# Patient Record
Sex: Female | Born: 1955 | Race: White | Hispanic: No | Smoking: Current every day smoker
Health system: Southern US, Community
[De-identification: ages and names within clinical notes are randomized; demographics above are authoritative.]

## PROBLEM LIST (undated history)

## (undated) DIAGNOSIS — I10 Essential (primary) hypertension: Secondary | ICD-10-CM

## (undated) DIAGNOSIS — Z86718 Personal history of other venous thrombosis and embolism: Secondary | ICD-10-CM

## (undated) DIAGNOSIS — F419 Anxiety disorder, unspecified: Secondary | ICD-10-CM

## (undated) DIAGNOSIS — F329 Major depressive disorder, single episode, unspecified: Secondary | ICD-10-CM

## (undated) DIAGNOSIS — F32A Depression, unspecified: Secondary | ICD-10-CM

## (undated) HISTORY — PX: DILATION AND CURETTAGE OF UTERUS: SHX78

## (undated) HISTORY — PX: CATARACT EXTRACTION: SUR2

## (undated) HISTORY — PX: TONSILLECTOMY: SUR1361

---

## 2014-10-08 ENCOUNTER — Emergency Department
Admission: EM | Admit: 2014-10-08 | Discharge: 2014-10-08 | Disposition: A | Discharge status: Home / Self Care | Payer: Self-pay | Source: Home / Self Care | Attending: Emergency Medicine | Admitting: Emergency Medicine

## 2014-10-08 ENCOUNTER — Encounter: Payer: Self-pay | Admitting: *Deleted

## 2014-10-08 ENCOUNTER — Emergency Department (INDEPENDENT_AMBULATORY_CARE_PROVIDER_SITE_OTHER): Payer: Self-pay

## 2014-10-08 DIAGNOSIS — J42 Unspecified chronic bronchitis: Secondary | ICD-10-CM

## 2014-10-08 DIAGNOSIS — J449 Chronic obstructive pulmonary disease, unspecified: Secondary | ICD-10-CM

## 2014-10-08 DIAGNOSIS — J209 Acute bronchitis, unspecified: Secondary | ICD-10-CM

## 2014-10-08 HISTORY — DX: Anxiety disorder, unspecified: F41.9

## 2014-10-08 HISTORY — DX: Essential (primary) hypertension: I10

## 2014-10-08 HISTORY — DX: Personal history of other venous thrombosis and embolism: Z86.718

## 2014-10-08 HISTORY — DX: Depression, unspecified: F32.A

## 2014-10-08 HISTORY — DX: Major depressive disorder, single episode, unspecified: F32.9

## 2014-10-08 MED ORDER — AZITHROMYCIN 250 MG PO TABS: 250.0000 mg | ORAL_TABLET | Freq: Every day | ORAL | Status: AC

## 2014-10-08 MED ORDER — ALBUTEROL SULFATE 108 (90 BASE) MCG/ACT IN AEPB: 2.0000 | INHALATION_SPRAY | Freq: Four times a day (QID) | RESPIRATORY_TRACT | Status: AC

## 2014-10-08 NOTE — Discharge Instructions (Signed)
Acute Bronchitis Bronchitis is inflammation of the airways that extend from the windpipe into the lungs (bronchi). The inflammation often causes mucus to develop. This leads to a cough, which is the most common symptom of bronchitis.  In acute bronchitis, the condition usually develops suddenly and goes away over time, usually in a couple weeks. Smoking, allergies, and asthma can make bronchitis worse. Repeated episodes of bronchitis may cause further lung problems.  CAUSES Acute bronchitis is most often caused by the same virus that causes a cold. The virus can spread from person to person (contagious) through coughing, sneezing, and touching contaminated objects. SIGNS AND SYMPTOMS   Cough.   Fever.   Coughing up mucus.   Body aches.   Chest congestion.   Chills.   Shortness of breath.   Sore throat.  DIAGNOSIS  Acute bronchitis is usually diagnosed through a physical exam. Your health care provider will also ask you questions about your medical history. Tests, such as chest X-rays, are sometimes done to rule out other conditions.  TREATMENT  Acute bronchitis usually goes away in a couple weeks. Oftentimes, no medical treatment is necessary. Medicines are sometimes given for relief of fever or cough. Antibiotic medicines are usually not needed but may be prescribed in certain situations. In some cases, an inhaler may be recommended to help reduce shortness of breath and control the cough. A cool mist vaporizer may also be used to help thin bronchial secretions and make it easier to clear the chest.  HOME CARE INSTRUCTIONS  Get plenty of rest.   Drink enough fluids to keep your urine clear or pale yellow (unless you have a medical condition that requires fluid restriction). Increasing fluids may help thin your respiratory secretions (sputum) and reduce chest congestion, and it will prevent dehydration.   Take medicines only as directed by your health care provider.  If  you were prescribed an antibiotic medicine, finish it all even if you start to feel better.  Avoid smoking and secondhand smoke. Exposure to cigarette smoke or irritating chemicals will make bronchitis worse. If you are a smoker, consider using nicotine gum or skin patches to help control withdrawal symptoms. Quitting smoking will help your lungs heal faster.   Reduce the chances of another bout of acute bronchitis by washing your hands frequently, avoiding people with cold symptoms, and trying not to touch your hands to your mouth, nose, or eyes.   Keep all follow-up visits as directed by your health care provider.  SEEK MEDICAL CARE IF: Your symptoms do not improve after 1 week of treatment.  SEEK IMMEDIATE MEDICAL CARE IF:  You develop an increased fever or chills.   You have chest pain.   You have severe shortness of breath.  You have bloody sputum.   You develop dehydration.  You faint or repeatedly feel like you are going to pass out.  You develop repeated vomiting.  You develop a severe headache. MAKE SURE YOU:   Understand these instructions.  Will watch your condition.  Will get help right away if you are not doing well or get worse. Document Released: 04/29/2004 Document Revised: 08/06/2013 Document Reviewed: 09/12/2012 [facility] Patient Information 2015 [place], [place]. This information is not intended to replace advice given to you by your health care provider. Make sure you discuss any questions you have with your health care provider.  Chronic Obstructive Pulmonary Disease Chronic obstructive pulmonary disease (COPD) is a common lung condition in which airflow from the lungs is limited. COPD  is a general term that can be used to describe many different lung problems that limit airflow, including both chronic bronchitis and emphysema. If you have COPD, your lung function will probably never return to normal, but there are measures you can take to improve lung  function and make yourself feel better.  CAUSES   Smoking (common).   Exposure to secondhand smoke.   Genetic problems.  Chronic inflammatory lung diseases or recurrent infections. SYMPTOMS   Shortness of breath, especially with physical activity.   Deep, persistent (chronic) cough with a large amount of thick mucus.   Wheezing.   Rapid breaths (tachypnea).   Gray or bluish discoloration (cyanosis) of the skin, especially in fingers, toes, or lips.   Fatigue.   Weight loss.   Frequent infections or episodes when breathing symptoms become much worse (exacerbations).   Chest tightness. DIAGNOSIS  Your health care provider will take a medical history and perform a physical examination to make the initial diagnosis. Additional tests for COPD may include:   Lung (pulmonary) function tests.  Chest X-ray.  CT scan.  Blood tests. TREATMENT  Treatment available to help you feel better when you have COPD includes:   Inhaler and nebulizer medicines. These help manage the symptoms of COPD and make your breathing more comfortable.  Supplemental oxygen. Supplemental oxygen is only helpful if you have a low oxygen level in your blood.   Exercise and physical activity. These are beneficial for nearly all people with COPD. Some people may also benefit from a pulmonary rehabilitation program. HOME CARE INSTRUCTIONS   Take all medicines (inhaled or pills) as directed by your health care provider.  Avoid over-the-counter medicines or cough syrups that dry up your airway (such as antihistamines) and slow down the elimination of secretions unless instructed otherwise by your health care provider.   If you are a smoker, the most important thing that you can do is stop smoking. Continuing to smoke will cause further lung damage and breathing trouble. Ask your health care provider for help with quitting smoking. He or she can direct you to community resources or hospitals  that provide support.  Avoid exposure to irritants such as smoke, chemicals, and fumes that aggravate your breathing.  Use oxygen therapy and pulmonary rehabilitation if directed by your health care provider. If you require home oxygen therapy, ask your health care provider whether you should purchase a pulse oximeter to measure your oxygen level at home.   Avoid contact with individuals who have a contagious illness.  Avoid extreme temperature and humidity changes.  Eat healthy foods. Eating smaller, more frequent meals and resting before meals may help you maintain your strength.  Stay active, but balance activity with periods of rest. Exercise and physical activity will help you maintain your ability to do things you want to do.  Preventing infection and hospitalization is very important when you have COPD. Make sure to receive all the vaccines your health care provider recommends, especially the pneumococcal and influenza vaccines. Ask your health care provider whether you need a pneumonia vaccine.  Learn and use relaxation techniques to manage stress.  Learn and use controlled breathing techniques as directed by your health care provider. Controlled breathing techniques include:   Pursed lip breathing. Start by breathing in (inhaling) through your nose for 1 second. Then, purse your lips as if you were going to whistle and breathe out (exhale) through the pursed lips for 2 seconds.   Diaphragmatic breathing. Start by putting one  hand on your abdomen just above your waist. Inhale slowly through your nose. The hand on your abdomen should move out. Then purse your lips and exhale slowly. You should be able to feel the hand on your abdomen moving in as you exhale.   Learn and use controlled coughing to clear mucus from your lungs. Controlled coughing is a series of short, progressive coughs. The steps of controlled coughing are:   Lean your head slightly forward.   Breathe in  deeply using diaphragmatic breathing.   Try to hold your breath for 3 seconds.   Keep your mouth slightly open while coughing twice.   Spit any mucus out into a tissue.   Rest and repeat the steps once or twice as needed. SEEK MEDICAL CARE IF:   You are coughing up more mucus than usual.   There is a change in the color or thickness of your mucus.   Your breathing is more labored than usual.   Your breathing is faster than usual.  SEEK IMMEDIATE MEDICAL CARE IF:   You have shortness of breath while you are resting.   You have shortness of breath that prevents you from:  Being able to talk.   Performing your usual physical activities.   You have chest pain lasting longer than 5 minutes.   Your skin color is more cyanotic than usual.  You measure low oxygen saturations for longer than 5 minutes with a pulse oximeter. MAKE SURE YOU:   Understand these instructions.  Will watch your condition.  Will get help right away if you are not doing well or get worse. Document Released: 12/30/2004 Document Revised: 08/06/2013 Document Reviewed: 11/16/2012 [facility] Patient Information 2015 [place], [place]. This information is not intended to replace advice given to you by your health care provider. Make sure you discuss any questions you have with your health care provider. Smoking Cessation Quitting smoking is important to your health and has many advantages. However, it is not always easy to quit since nicotine is a very addictive drug. Oftentimes, people try 3 times or more before being able to quit. This document explains the best ways for you to prepare to quit smoking. Quitting takes hard work and a lot of effort, but you can do it. ADVANTAGES OF QUITTING SMOKING  You will live longer, feel better, and live better.  Your body will feel the impact of quitting smoking almost immediately.  Within 20 minutes, blood pressure decreases. Your pulse returns to its normal  level.  After 8 hours, carbon monoxide levels in the blood return to normal. Your oxygen level increases.  After 24 hours, the chance of having a heart attack starts to decrease. Your breath, hair, and body stop smelling like smoke.  After 48 hours, damaged nerve endings begin to recover. Your sense of taste and smell improve.  After 72 hours, the body is virtually free of nicotine. Your bronchial tubes relax and breathing becomes easier.  After 2 to 12 weeks, lungs can hold more air. Exercise becomes easier and circulation improves.  The risk of having a heart attack, stroke, cancer, or lung disease is greatly reduced.  After 1 year, the risk of coronary heart disease is cut in half.  After 5 years, the risk of stroke falls to the same as a nonsmoker.  After 10 years, the risk of lung cancer is cut in half and the risk of other cancers decreases significantly.  After 15 years, the risk of coronary heart disease drops,  usually to the level of a nonsmoker.  If you are pregnant, quitting smoking will improve your chances of having a healthy baby.  The people you live with, especially any children, will be healthier.  You will have extra money to spend on things other than cigarettes. QUESTIONS TO THINK ABOUT BEFORE ATTEMPTING TO QUIT You may want to talk about your answers with your health care provider.  Why do you want to quit?  If you tried to quit in the past, what helped and what did not?  What will be the most difficult situations for you after you quit? How will you plan to handle them?  Who can help you through the tough times? Your family? Friends? A health care provider?  What pleasures do you get from smoking? What ways can you still get pleasure if you quit? Here are some questions to ask your health care provider:  How can you help me to be successful at quitting?  What medicine do you think would be best for me and how should I take it?  What should I do if I  need more help?  What is smoking withdrawal like? How can I get information on withdrawal? GET READY  Set a quit date.  Change your environment by getting rid of all cigarettes, ashtrays, matches, and lighters in your home, car, or work. Do not let people smoke in your home.  Review your past attempts to quit. Think about what worked and what did not. GET SUPPORT AND ENCOURAGEMENT You have a better chance of being successful if you have help. You can get support in many ways.  Tell your family, friends, and coworkers that you are going to quit and need their support. Ask them not to smoke around you.  Get individual, group, or telephone counseling and support. Programs are available at [employer] and health centers. Call your local health department for information about programs in your area.  Spiritual beliefs and practices may help some smokers quit.  Download a "quit meter" on your computer to keep track of quit statistics, such as how long you have gone without smoking, cigarettes not smoked, and money saved.  Get a self-help book about quitting smoking and staying off tobacco. LEARN NEW SKILLS AND BEHAVIORS  Distract yourself from urges to smoke. Talk to someone, go for a walk, or occupy your time with a task.  Change your normal routine. Take a different route to work. Drink tea instead of coffee. Eat breakfast in a different place.  Reduce your stress. Take a hot bath, exercise, or read a book.  Plan something enjoyable to do every day. Reward yourself for not smoking.  Explore interactive web-based programs that specialize in helping you quit. GET MEDICINE AND USE IT CORRECTLY Medicines can help you stop smoking and decrease the urge to smoke. Combining medicine with the above behavioral methods and support can greatly increase your chances of successfully quitting smoking.  Nicotine replacement therapy helps deliver nicotine to your body without the negative effects  and risks of smoking. Nicotine replacement therapy includes nicotine gum, lozenges, inhalers, nasal sprays, and skin patches. Some may be available over-the-counter and others require a prescription.  Antidepressant medicine helps people abstain from smoking, but how this works is unknown. This medicine is available by prescription.  Nicotinic receptor partial agonist medicine simulates the effect of nicotine in your brain. This medicine is available by prescription. Ask your health care provider for advice about which medicines to use  and how to use them based on your health history. Your health care provider will tell you what side effects to look out for if you choose to be on a medicine or therapy. Carefully read the information on the package. Do not use any other product containing nicotine while using a nicotine replacement product.  RELAPSE OR DIFFICULT SITUATIONS Most relapses occur within the first 3 months after quitting. Do not be discouraged if you start smoking again. Remember, most people try several times before finally quitting. You may have symptoms of withdrawal because your body is used to nicotine. You may crave cigarettes, be irritable, feel very hungry, cough often, get headaches, or have difficulty concentrating. The withdrawal symptoms are only temporary. They are strongest when you first quit, but they will go away within 10-14 days. To reduce the chances of relapse, try to:  Avoid drinking alcohol. Drinking lowers your chances of successfully quitting.  Reduce the amount of caffeine you consume. Once you quit smoking, the amount of caffeine in your body increases and can give you symptoms, such as a rapid heartbeat, sweating, and anxiety.  Avoid smokers because they can make you want to smoke.  Do not let weight gain distract you. Many smokers will gain weight when they quit, usually less than 10 pounds. Eat a healthy diet and stay active. You can always lose the weight  gained after you quit.  Find ways to improve your mood other than smoking. FOR MORE INFORMATION  www.smokefree.gov  Document Released: 03/16/2001 Document Revised: 08/06/2013 Document Reviewed: 07/01/2011 [employer] Patient Information 2015 [place], [place]. This information is not intended to replace advice given to you by your health care provider. Make sure you discuss any questions you have with your health care provider.

## 2014-10-08 NOTE — ED Notes (Signed)
Pt c/o productive cough, congestion, SOB and right ear pain x 1 week. 02 sat 90%. 40year smoker. Denies H/o COPD or asthma.

## 2014-10-08 NOTE — ED Provider Notes (Signed)
CSN: [id number]     Arrival date & time 10/08/14  1348 History   First MD Initiated Contact with Patient 10/08/14 1444     Chief Complaint  Patient presents with  . Cough  . Nasal Congestion   (Consider location/radiation/quality/duration/timing/severity/associated sxs/prior Treatment) Patient is a 59 y.o. female presenting with cough. The history is provided by the patient. No language interpreter was used.  Cough Cough characteristics:  Productive Sputum characteristics:  Green Severity:  Severe Onset quality:  Gradual Duration:  1 week Timing:  Constant Progression:  Worsening Chronicity:  Recurrent Smoker: yes   Context: smoke exposure   Worsened by:  Nothing tried Ineffective treatments:  None tried Associated symptoms: shortness of breath   Associated symptoms: no chest pain and no fever     Past Medical History  Diagnosis Date  . Hypertension   . Anxiety   . Depression   . History of blood clots    Past Surgical History  Procedure Laterality Date  . Tonsillectomy    . Cesarean section    . Dilation and curettage of uterus    . Cataract extraction     Family History  Problem Relation Age of Onset  . Cancer Other   . Diabetes Other    History  Substance Use Topics  . Smoking status: Current Every Day Smoker -- 1.00 packs/day for 40 years    Types: Cigarettes  . Smokeless tobacco: Never Used  . Alcohol Use: Yes   OB History    No data available     Review of Systems  Constitutional: Negative for fever.  Respiratory: Positive for cough and shortness of breath.   Cardiovascular: Negative for chest pain.  All other systems reviewed and are negative. Pt has been a smoker for 40 years.   Pt smokes 1 pack a week.  Pt reports she used to smoke 2 packs  Allergies  Review of patient's allergies indicates no known allergies.  Home Medications   Prior to Admission medications   Not on File   BP 173/91 mmHg  Pulse 92  Temp(Src) 98.6 F (37 C) (Oral)   Resp 16  Ht 4\' 11"  (1.499 m)  Wt 126 lb (57.153 kg)  BMI 25.44 kg/m2  SpO2 90% Physical Exam  Constitutional: She is oriented to person, place, and time. She appears well-developed and well-nourished.  HENT:  Head: Normocephalic and atraumatic.  Right Ear: External ear normal.  Left Ear: External ear normal.  Mouth/Throat: Oropharynx is clear and moist.  Eyes: Conjunctivae and EOM are normal. Pupils are equal, round, and reactive to light.  Neck: Normal range of motion.  Cardiovascular: Normal rate and normal heart sounds.   Pulmonary/Chest: Effort normal. No respiratory distress.  Thonchi,   Abdominal: Soft. She exhibits no distension.  Musculoskeletal: Normal range of motion.  Neurological: She is alert and oriented to person, place, and time.  Skin: Skin is warm.  Psychiatric: She has a normal mood and affect.  Nursing note and vitals reviewed.   ED Course  Procedures (including critical care time) Labs Review Labs Reviewed - No data to display  Imaging Review Dg Chest 2 View  10/08/2014   CLINICAL DATA:  Cough, congestion, runny nose, trouble breathing, history hypertension and smoking  EXAM: CHEST  2 VIEW  COMPARISON:  None  FINDINGS: Normal heart size, mediastinal contours and pulmonary vascularity.  Minimal atherosclerotic calcification aortic arch.  Emphysematous and bronchitic changes likely representing COPD.  No pulmonary infiltrate, pleural effusion or pneumothorax.  Bones demineralized with minimal thoracolumbar scoliosis and scattered degenerative disc disease changes.  IMPRESSION: COPD changes.  No acute abnormalities.   Electronically Signed   By: Ulyses Southward M.D.   On: 10/08/2014 14:55     MDM  Pt counseled on xray results and the need to stop smoking Pt given rx for albuterol inhaler  respclick with coupon and zithromax.  Pt given primary care referrals here and wellness center   1. Acute bronchitis, unspecified organism   2. Chronic bronchitis, unspecified  chronic bronchitis type    AVS    Elson Areas, PA-C 10/08/14 1639

## 2016-06-29 IMAGING — CR DG CHEST 2V
2 series · 2 of 2 positions shown · non-contrast
Comparison: None

CLINICAL DATA: Cough, congestion, runny nose, trouble breathing,
history hypertension and smoking

EXAM:
CHEST  2 VIEW

[chest pa]
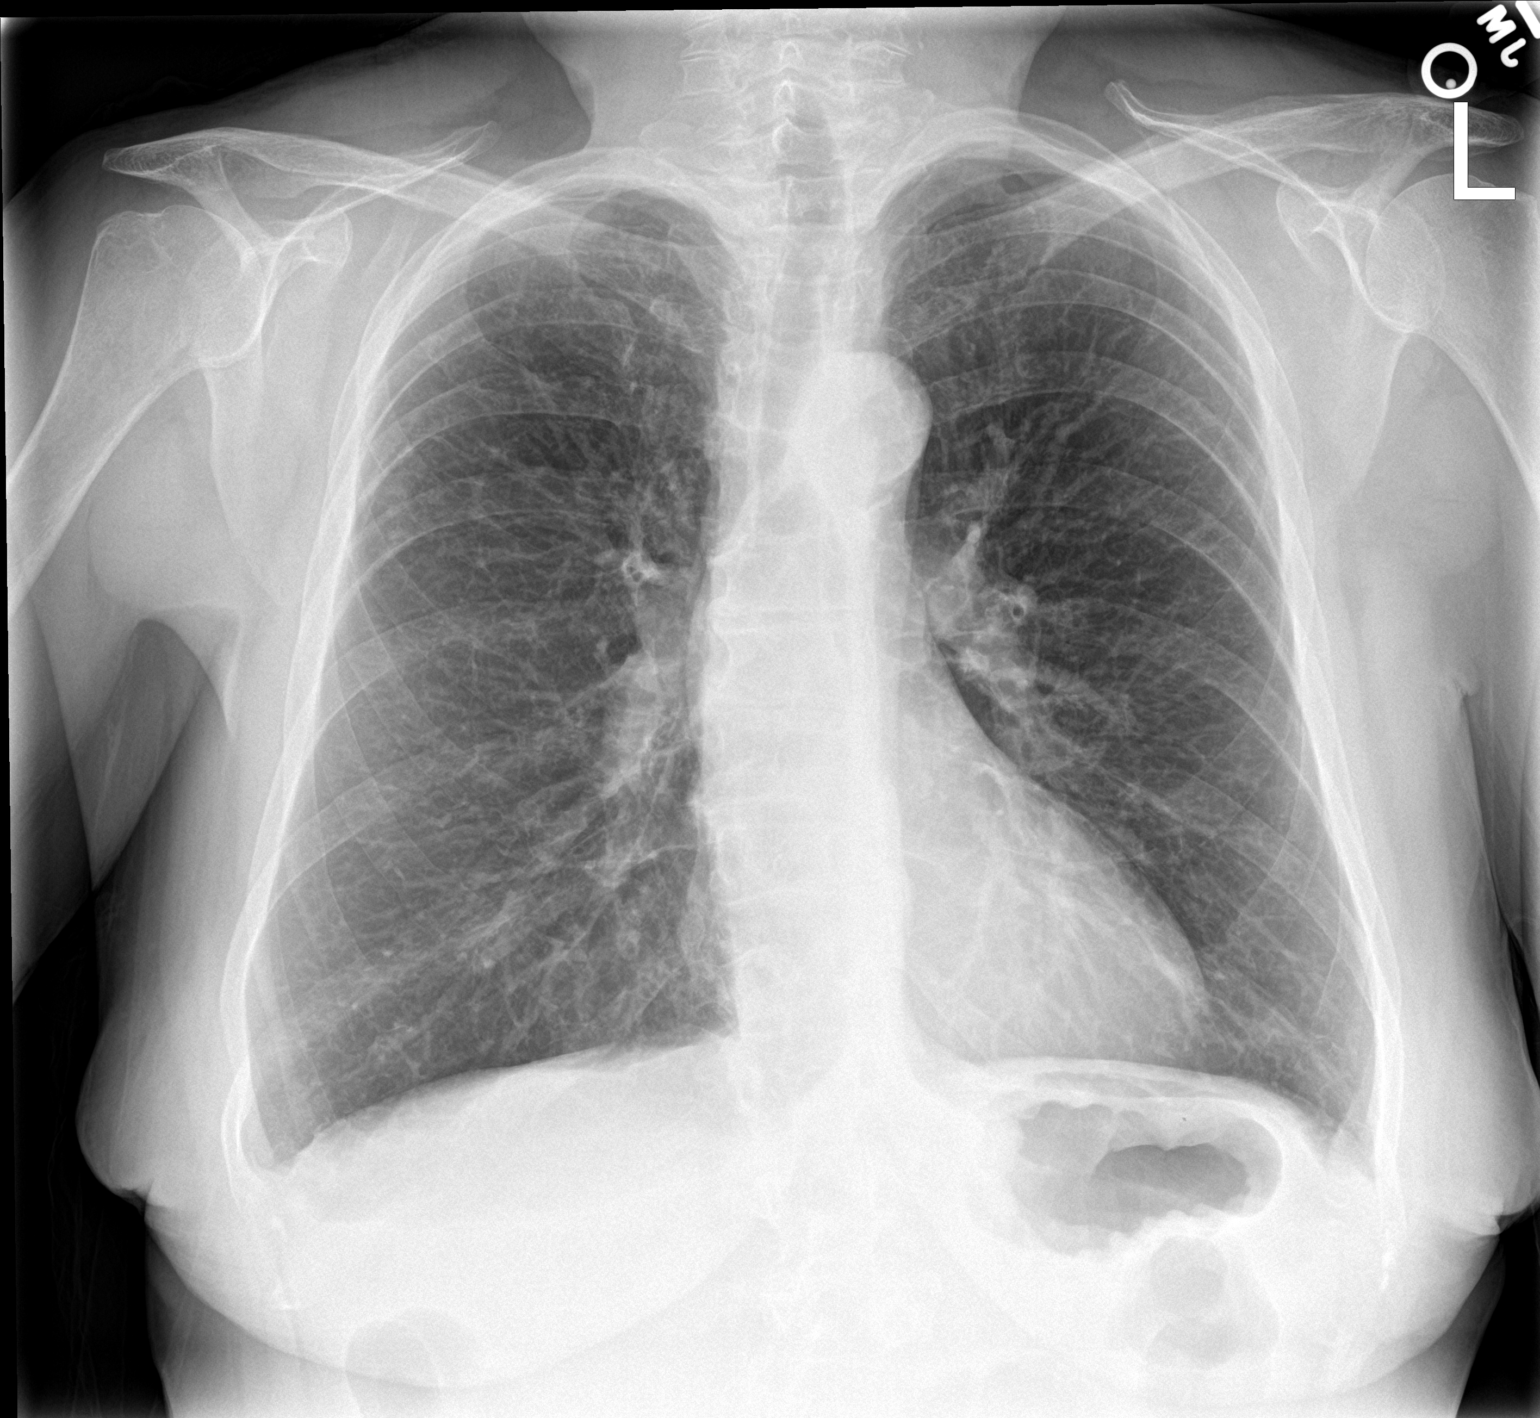

[chest lat]
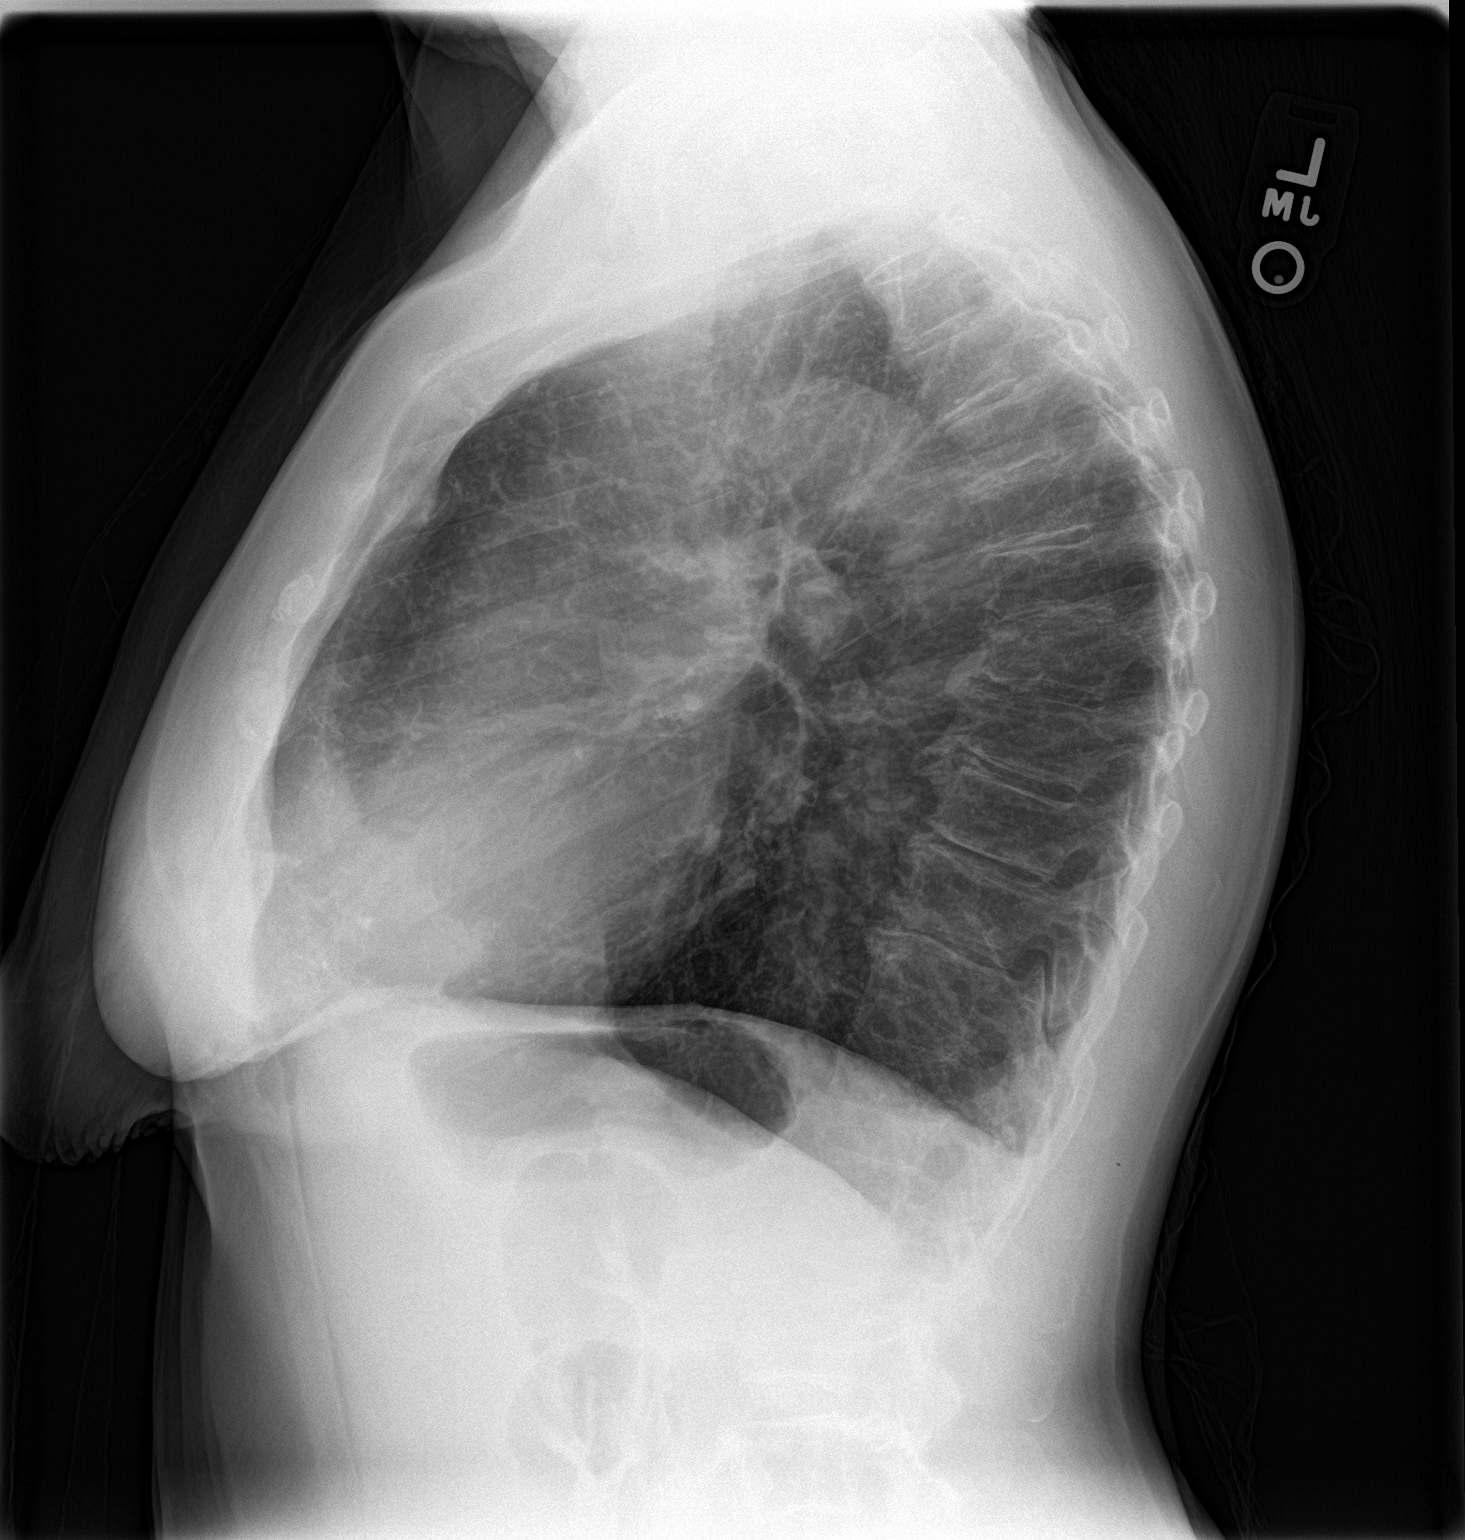

[2 of 2 positions shown; findings below may reference images not displayed]

FINDINGS: Normal heart size, mediastinal contours and pulmonary vascularity.

Minimal atherosclerotic calcification aortic arch.

Emphysematous and bronchitic changes likely representing COPD.

No pulmonary infiltrate, pleural effusion or pneumothorax.

Bones demineralized with minimal thoracolumbar scoliosis and
scattered degenerative disc disease changes.
IMPRESSION: COPD changes.

No acute abnormalities.
